# Patient Record
Sex: Female | Born: 1935 | Race: White | Hispanic: No | Smoking: Never smoker
Health system: Southern US, Community
[De-identification: ages and names within clinical notes are randomized; demographics above are authoritative.]

## PROBLEM LIST (undated history)

## (undated) DIAGNOSIS — C801 Malignant (primary) neoplasm, unspecified: Secondary | ICD-10-CM

## (undated) DIAGNOSIS — E119 Type 2 diabetes mellitus without complications: Secondary | ICD-10-CM

## (undated) DIAGNOSIS — I10 Essential (primary) hypertension: Secondary | ICD-10-CM

## (undated) DIAGNOSIS — R251 Tremor, unspecified: Secondary | ICD-10-CM

## (undated) DIAGNOSIS — F419 Anxiety disorder, unspecified: Secondary | ICD-10-CM

## (undated) DIAGNOSIS — I2510 Atherosclerotic heart disease of native coronary artery without angina pectoris: Secondary | ICD-10-CM

## (undated) DIAGNOSIS — N289 Disorder of kidney and ureter, unspecified: Secondary | ICD-10-CM

## (undated) DIAGNOSIS — I509 Heart failure, unspecified: Secondary | ICD-10-CM

## (undated) HISTORY — PX: LAPAROSCOPY: SHX197

## (undated) HISTORY — PX: ABDOMINAL HYSTERECTOMY: SHX81

## (undated) HISTORY — PX: TONSILLECTOMY: SUR1361

## (undated) HISTORY — PX: MASTECTOMY: SHX3

## (undated) HISTORY — PX: KIDNEY SURGERY: SHX687

## (undated) HISTORY — PX: ABDOMINAL SURGERY: SHX537

## (undated) HISTORY — PX: APPENDECTOMY: SHX54

---

## 2015-03-26 ENCOUNTER — Emergency Department (HOSPITAL_BASED_OUTPATIENT_CLINIC_OR_DEPARTMENT_OTHER): Payer: Medicare Other

## 2015-03-26 ENCOUNTER — Encounter (HOSPITAL_BASED_OUTPATIENT_CLINIC_OR_DEPARTMENT_OTHER): Payer: Self-pay | Admitting: *Deleted

## 2015-03-26 ENCOUNTER — Emergency Department (HOSPITAL_BASED_OUTPATIENT_CLINIC_OR_DEPARTMENT_OTHER)
Admission: EM | Admit: 2015-03-26 | Discharge: 2015-03-26 | Disposition: A | Discharge status: Home / Self Care | Payer: Medicare Other | Attending: Emergency Medicine | Admitting: Emergency Medicine

## 2015-03-26 DIAGNOSIS — K625 Hemorrhage of anus and rectum: Secondary | ICD-10-CM | POA: Insufficient documentation

## 2015-03-26 DIAGNOSIS — W19XXXA Unspecified fall, initial encounter: Secondary | ICD-10-CM

## 2015-03-26 DIAGNOSIS — S79911A Unspecified injury of right hip, initial encounter: Secondary | ICD-10-CM | POA: Insufficient documentation

## 2015-03-26 DIAGNOSIS — Z859 Personal history of malignant neoplasm, unspecified: Secondary | ICD-10-CM | POA: Insufficient documentation

## 2015-03-26 DIAGNOSIS — S0990XA Unspecified injury of head, initial encounter: Secondary | ICD-10-CM | POA: Insufficient documentation

## 2015-03-26 DIAGNOSIS — Y999 Unspecified external cause status: Secondary | ICD-10-CM | POA: Diagnosis not present

## 2015-03-26 DIAGNOSIS — E119 Type 2 diabetes mellitus without complications: Secondary | ICD-10-CM | POA: Insufficient documentation

## 2015-03-26 DIAGNOSIS — I2510 Atherosclerotic heart disease of native coronary artery without angina pectoris: Secondary | ICD-10-CM | POA: Insufficient documentation

## 2015-03-26 DIAGNOSIS — Y939 Activity, unspecified: Secondary | ICD-10-CM | POA: Insufficient documentation

## 2015-03-26 DIAGNOSIS — R55 Syncope and collapse: Secondary | ICD-10-CM | POA: Insufficient documentation

## 2015-03-26 DIAGNOSIS — Z7982 Long term (current) use of aspirin: Secondary | ICD-10-CM | POA: Diagnosis not present

## 2015-03-26 DIAGNOSIS — M25551 Pain in right hip: Secondary | ICD-10-CM

## 2015-03-26 DIAGNOSIS — R519 Headache, unspecified: Secondary | ICD-10-CM

## 2015-03-26 DIAGNOSIS — I509 Heart failure, unspecified: Secondary | ICD-10-CM | POA: Insufficient documentation

## 2015-03-26 DIAGNOSIS — Y929 Unspecified place or not applicable: Secondary | ICD-10-CM | POA: Insufficient documentation

## 2015-03-26 DIAGNOSIS — W01198A Fall on same level from slipping, tripping and stumbling with subsequent striking against other object, initial encounter: Secondary | ICD-10-CM | POA: Insufficient documentation

## 2015-03-26 DIAGNOSIS — Z79899 Other long term (current) drug therapy: Secondary | ICD-10-CM | POA: Insufficient documentation

## 2015-03-26 DIAGNOSIS — R51 Headache: Secondary | ICD-10-CM

## 2015-03-26 DIAGNOSIS — Z87448 Personal history of other diseases of urinary system: Secondary | ICD-10-CM | POA: Diagnosis not present

## 2015-03-26 DIAGNOSIS — S3992XA Unspecified injury of lower back, initial encounter: Secondary | ICD-10-CM | POA: Diagnosis not present

## 2015-03-26 DIAGNOSIS — Z794 Long term (current) use of insulin: Secondary | ICD-10-CM | POA: Insufficient documentation

## 2015-03-26 HISTORY — DX: Type 2 diabetes mellitus without complications: E11.9

## 2015-03-26 HISTORY — DX: Malignant (primary) neoplasm, unspecified: C80.1

## 2015-03-26 HISTORY — DX: Disorder of kidney and ureter, unspecified: N28.9

## 2015-03-26 HISTORY — DX: Heart failure, unspecified: I50.9

## 2015-03-26 HISTORY — DX: Atherosclerotic heart disease of native coronary artery without angina pectoris: I25.10

## 2015-03-26 LAB — CBC WITH DIFFERENTIAL/PLATELET
Basophils Absolute: 0 10*3/uL (ref 0.0–0.1)
Basophils Relative: 1 % (ref 0–1)
EOS ABS: 0.2 10*3/uL (ref 0.0–0.7)
Eosinophils Relative: 2 % (ref 0–5)
HCT: 34.6 % — ABNORMAL LOW (ref 36.0–46.0)
Hemoglobin: 12.2 g/dL (ref 12.0–15.0)
LYMPHS ABS: 2.8 10*3/uL (ref 0.7–4.0)
Lymphocytes Relative: 34 % (ref 12–46)
MCH: 33.7 pg (ref 26.0–34.0)
MCHC: 35.3 g/dL (ref 30.0–36.0)
MCV: 95.6 fL (ref 78.0–100.0)
MONOS PCT: 8 % (ref 3–12)
Monocytes Absolute: 0.7 10*3/uL (ref 0.1–1.0)
NEUTROS PCT: 55 % (ref 43–77)
Neutro Abs: 4.6 10*3/uL (ref 1.7–7.7)
Platelets: 342 10*3/uL (ref 150–400)
RBC: 3.62 MIL/uL — ABNORMAL LOW (ref 3.87–5.11)
RDW: 11.3 % — ABNORMAL LOW (ref 11.5–15.5)
WBC: 8.3 10*3/uL (ref 4.0–10.5)

## 2015-03-26 LAB — BASIC METABOLIC PANEL
ANION GAP: 8 (ref 5–15)
BUN: 22 mg/dL — ABNORMAL HIGH (ref 6–20)
CHLORIDE: 98 mmol/L — AB (ref 101–111)
CO2: 29 mmol/L (ref 22–32)
CREATININE: 0.94 mg/dL (ref 0.44–1.00)
Calcium: 10.4 mg/dL — ABNORMAL HIGH (ref 8.9–10.3)
GFR calc Af Amer: >60 mL/min (ref >60)
GFR calc non Af Amer: 55 mL/min — ABNORMAL LOW (ref >60)
Glucose, Bld: 176 mg/dL — ABNORMAL HIGH (ref 65–99)
POTASSIUM: 3.5 mmol/L (ref 3.5–5.1)
SODIUM: 135 mmol/L (ref 135–145)

## 2015-03-26 LAB — URINALYSIS, ROUTINE W REFLEX MICROSCOPIC
BILIRUBIN URINE: NEGATIVE
GLUCOSE, UA: 250 mg/dL — AB
HGB URINE DIPSTICK: NEGATIVE
KETONES UR: NEGATIVE mg/dL
NITRITE: NEGATIVE
PROTEIN: NEGATIVE mg/dL
Specific Gravity, Urine: 1.014 (ref 1.005–1.030)
UROBILINOGEN UA: 0.2 mg/dL (ref 0.0–1.0)
pH: 6 (ref 5.0–8.0)

## 2015-03-26 LAB — URINE MICROSCOPIC-ADD ON

## 2015-03-26 LAB — TROPONIN I: Troponin I: 0.03 ng/mL (ref <0.031)

## 2015-03-26 LAB — OCCULT BLOOD X 1 CARD TO LAB, STOOL: Fecal Occult Bld: NEGATIVE

## 2015-03-26 MED ORDER — ACETAMINOPHEN 325 MG PO TABS
650.0000 mg | ORAL_TABLET | Freq: Once | ORAL | Status: AC
  Administered 2015-03-26: 650 mg via ORAL
  Filled 2015-03-26: qty 2

## 2015-03-26 NOTE — ED Notes (Signed)
MD at bedside. 

## 2015-03-26 NOTE — ED Provider Notes (Signed)
CSN: [id number]     Arrival date & time 03/26/15  1711 History  This chart was scribed for Serita Grit, MD by Rayna Sexton, ED scribe. This patient was seen in room MH01/MH01 and the patient's care was started at 8:13 PM.  Chief Complaint  Patient presents with  . Fall   Patient is a 81 y.o. female presenting with fall. The history is provided by the patient and a relative. No language interpreter was used.  Fall This is a new problem. The current episode started more than 2 days ago. The problem occurs constantly. The problem has not changed since onset.Associated symptoms include headaches. Pertinent negatives include no chest pain and no shortness of breath. She has tried acetaminophen for the symptoms. The treatment provided mild relief.    HPI Comments: Regina Mccullough is a 81 y.o. female who presents to the Emergency Department complaining of a fall that occurred 5 days ago. Her daughter notes that she lost consciousness during the fall, hit her head on a chair and landed on concrete. She was taken to another ED immediately following the fall and had imaging performed to her head and neck with negative results but further notes that she was told by another physician that she needs a "full body scan". Her daughter notes that she saw fresh blood when wiping her rectum s/p a BM this morning and further notes pain to her right hip, buttock, lower back and posterior head and is unsure if she had any feelings of SOB or CP before or during the incident. She notes taking tylenol for her pain and experiencing temporary relief. Although her records indicate that she denied a LOC at her last ED visit, her daughter notes that they were supposed to have corrected this. Her daughter notes that she has stage three renal disease and a SHx to her kidneys. She denies any more episodes of syncope, dizziness, SOB and CP.   Past Medical History  Diagnosis Date  . Coronary artery disease   . Diabetes mellitus  without complication   . Renal disorder   . CHF (congestive heart failure)   . Cancer    Past Surgical History  Procedure Laterality Date  . Kidney surgery    . Abdominal hysterectomy    . Laparoscopy    . Tonsillectomy    . Abdominal surgery    . Appendectomy    . Mastectomy     No family history on file. History  Substance Use Topics  . Smoking status: Never Smoker   . Smokeless tobacco: Not on file  . Alcohol Use: No   OB History    No data available     Review of Systems  Respiratory: Negative for shortness of breath.   Cardiovascular: Negative for chest pain.  Gastrointestinal: Positive for anal bleeding.  Musculoskeletal: Positive for myalgias, back pain and arthralgias.  Neurological: Positive for headaches. Negative for dizziness, syncope and light-headedness.  All other systems reviewed and are negative.  Allergies  Actos; Januvia; Lipitor; Micronase; Neurontin; Propranolol; and Sulfa antibiotics  Home Medications   Prior to Admission medications   Medication Sig Start Date End Date Taking? Authorizing Provider  acetaminophen (TYLENOL) 500 MG tablet Take 1,000 mg by mouth every 6 (six) hours as needed.   Yes Historical Provider, MD  albuterol (PROVENTIL HFA;VENTOLIN HFA) 108 (90 BASE) MCG/ACT inhaler Inhale 1-2 puffs into the lungs every 6 (six) hours as needed for wheezing or shortness of breath.   Yes Historical Provider, MD  aspirin 81 MG tablet Take 81 mg by mouth daily.   Yes Historical Provider, MD  cholecalciferol (VITAMIN D) 400 UNITS TABS tablet Take 400 Units by mouth.   Yes Historical Provider, MD  Cinnamon 500 MG capsule Take 500 mg by mouth daily.   Yes Historical Provider, MD  co-enzyme Q-10 50 MG capsule Take 50 mg by mouth daily.   Yes Historical Provider, MD  diphenoxylate-atropine (LOMOTIL) 2.5-0.025 MG per tablet Take by mouth 4 (four) times daily as needed for diarrhea or loose stools.   Yes Historical Provider, MD  glimepiride (AMARYL) 2  MG tablet Take 2 mg by mouth daily with breakfast.   Yes Historical Provider, MD  guaiFENesin-codeine (ROBITUSSIN AC) 100-10 MG/5ML syrup Take 5 mLs by mouth 3 (three) times daily as needed for cough.   Yes Historical Provider, MD  hydrochlorothiazide (MICROZIDE) 12.5 MG capsule Take 12.5 mg by mouth daily.   Yes Historical Provider, MD  HYDROcodone-homatropine (HYCODAN) 5-1.5 MG/5ML syrup Take 5 mLs by mouth every 6 (six) hours as needed for cough.   Yes Historical Provider, MD  insulin glargine (LANTUS) 100 UNIT/ML injection Inject 100 Units into the skin at bedtime.   Yes Historical Provider, MD  loperamide (IMODIUM) 2 MG capsule Take by mouth as needed for diarrhea or loose stools.   Yes Historical Provider, MD  losartan (COZAAR) 25 MG tablet Take 25 mg by mouth daily.   Yes Historical Provider, MD  metoprolol succinate (TOPROL-XL) 50 MG 24 hr tablet Take 50 mg by mouth daily. Take with or immediately following a meal.   Yes Historical Provider, MD  rosuvastatin (CRESTOR) 5 MG tablet Take 5 mg by mouth daily.   Yes Historical Provider, MD  tiZANidine (ZANAFLEX) 2 MG tablet Take by mouth every 6 (six) hours as needed for muscle spasms.   Yes Historical Provider, MD  traZODone (DESYREL) 100 MG tablet Take 100 mg by mouth at bedtime.   Yes Historical Provider, MD  valACYclovir (VALTREX) 500 MG tablet Take 500 mg by mouth 2 (two) times daily.   Yes Historical Provider, MD   BP 148/57 mmHg  Pulse 88  Temp(Src) 98.7 F (37.1 C) (Oral)  Resp 18  Ht 5\' 6"  (1.676 m)  Wt 145 lb (65.772 kg)  BMI 23.41 kg/m2  SpO2 96% Physical Exam  Constitutional: She is oriented to person, place, and time. She appears well-developed and well-nourished. No distress.  HENT:  Head: Normocephalic and atraumatic. Head is without raccoon's eyes, without Battle's sign and without contusion (occipital tenderness).  Nose: Nose normal.  Eyes: Conjunctivae and EOM are normal. Pupils are equal, round, and reactive to light.  No scleral icterus.  Neck: No spinous process tenderness and no muscular tenderness present.  Cardiovascular: Normal rate, regular rhythm, normal heart sounds and intact distal pulses.   No murmur heard. Pulmonary/Chest: Effort normal and breath sounds normal. She has no rales. She exhibits no tenderness.  Abdominal: Soft. There is no tenderness. There is no rebound and no guarding.  Genitourinary: Rectal exam shows no external hemorrhoid, no fissure, no mass, no tenderness and anal tone normal.     No stool in rectal vault and no gross blood.  Musculoskeletal: Normal range of motion. She exhibits no edema.       Right hip: She exhibits tenderness and bony tenderness. She exhibits normal range of motion (NV intact distally), normal strength, no swelling and no deformity.       Thoracic back: She exhibits no tenderness and no  bony tenderness.       Lumbar back: She exhibits no tenderness and no bony tenderness.  No evidence of trauma to extremities, except as noted.  2+ distal pulses.    Neurological: She is alert and oriented to person, place, and time.  Skin: Skin is warm and dry. No rash noted.  Psychiatric: She has a normal mood and affect.  Nursing note and vitals reviewed.   ED Course  Procedures  DIAGNOSTIC STUDIES: Oxygen Saturation is 96% on RA, normal by my interpretation.    COORDINATION OF CARE: 8:28 PM Discussed treatment plan with pt at bedside and pt agreed to plan.  Labs Review Labs Reviewed  URINALYSIS, ROUTINE W REFLEX MICROSCOPIC (NOT AT [facility]) - Abnormal; Notable for the following:    Glucose, UA 250 (*)    Leukocytes, UA TRACE (*)    All other components within normal limits  CBC WITH DIFFERENTIAL/PLATELET - Abnormal; Notable for the following:    RBC 3.62 (*)    HCT 34.6 (*)    RDW 11.3 (*)    All other components within normal limits  BASIC METABOLIC PANEL - Abnormal; Notable for the following:    Chloride 98 (*)    Glucose, Bld 176 (*)    BUN 22 (*)     Calcium 10.4 (*)    GFR calc non Af Amer 55 (*)    All other components within normal limits  URINE MICROSCOPIC-ADD ON  TROPONIN I    Imaging Review Dg Hip Unilat With Pelvis 2-3 Views Right  03/26/2015   CLINICAL DATA:  [place] 5 days ago. Landed on concrete. Right hip pain since that time. Pain is posterior. Unable to bear weight.  EXAM: DG HIP (WITH OR WITHOUT PELVIS) 2-3V RIGHT  COMPARISON:  None.  FINDINGS: There is no evidence of hip fracture or dislocation. Mild degenerative changes in both hips.  IMPRESSION: No radiographic evidence for fracture. Consider MRI as needed depending the clinical level of suspicion for fracture.   Electronically Signed   By: Nolon Nations M.D.   On: 03/26/2015 21:56     EKG Interpretation   Date/Time:  Monday March 26 2015 21:05:15 EDT Ventricular Rate:  68 PR Interval:  232 QRS Duration: 86 QT Interval:  394 QTC Calculation: 418 R Axis:   31 Text Interpretation:  Sinus rhythm with 1st degree A-V block Otherwise  normal ECG No old tracing to compare Confirmed by [facility]  MD, TREY ([id number])  on 03/26/2015 9:34:28 PM     MDM   Final diagnoses:  Fall  Syncope, unspecified syncope type  Right hip pain  Nonintractable headache, unspecified chronicity pattern, unspecified headache type    81 yo female who passed out last week which caused her to fall and strike her head.  Daughter reports that she went to [facility], where she had a CT scan of head.  On chart review, she negative head and C spine CTs.  Also had EKG, but unable to view this remotely.  Daughter does not think [facility] knew that she passed out.  Plan to eval her right hip and check basic labs for syncope eval.  Given time since syncope (and report of no syncope or near syncope since) she can discharged if workup negative.    She also reports one episode of bright red blood on her toilet paper after a bowel movement this morning.  None since.  No evidence of GI bleed on exam.   Again, labs should  help risk stratify.    11:11 PM labs reassuring.  Right hip xr negative.  I don't suspect occult hip fracture as it has been several days since injury and she has been ambulatory.  Plan dc with PC Pfollow up.    I personally performed the services described in this documentation, which was scribed in my presence. The recorded information has been reviewed and is accurate.     Serita Grit, MD 03/26/15 2312

## 2015-03-26 NOTE — ED Notes (Signed)
Pt passed out and fell last Wednesday and was seen at [facility] ER. Pt sts she continues to have a HA and right buttocks pain. Pt also c/o of one episode today of bright red blood on the toilet paper after a BM. Pt sts no blood in her stool and she denies hemorrhoids.

## 2015-03-26 NOTE — ED Notes (Signed)
Patient was seen at [facility] for fall. States she had CT head.  Daughter reports "we would like a full body scan".  Explained that we don't scan "full body" and that it would be more appropriate to scan where she complains of pain.  Daughter states "well she hit her whole body when she fell".  Patient states "I hurt back here (pointing to lower back) and in my head".

## 2015-03-26 NOTE — ED Notes (Signed)
MD at bedside.  Obtained hemoccult with myself at the bedside.  Sent to lab.

## 2015-03-26 NOTE — ED Notes (Signed)
Nurse first-pt alert-NAD-using own walker-refused w/c

## 2015-03-26 NOTE — Discharge Instructions (Signed)
Syncope °Syncope is a medical term for fainting or passing out. This means you lose consciousness and drop to the ground. People are generally unconscious for less than 5 minutes. You may have some muscle twitches for up to 15 seconds before waking up and returning to normal. Syncope occurs more often in older adults, but it can happen to anyone. While most causes of syncope are not dangerous, syncope can be a sign of a serious medical problem. It is important to seek medical care.  °CAUSES  °Syncope is caused by a sudden drop in blood flow to the brain. The specific cause is often not determined. Factors that can bring on syncope include: °· Taking medicines that lower blood pressure. °· Sudden changes in posture, such as standing up quickly. °· Taking more medicine than prescribed. °· Standing in one place for too long. °· Seizure disorders. °· Dehydration and excessive exposure to heat. °· Low blood sugar (hypoglycemia). °· Straining to have a bowel movement. °· Heart disease, irregular heartbeat, or other circulatory problems. °· Fear, emotional distress, seeing blood, or severe pain. °SYMPTOMS  °Right before fainting, you may: °· Feel dizzy or light-headed. °· Feel nauseous. °· See all white or all black in your field of vision. °· Have cold, clammy skin. °DIAGNOSIS  °Your health care provider will ask about your symptoms, perform a physical exam, and perform an electrocardiogram (ECG) to record the electrical activity of your heart. Your health care provider may also perform other heart or blood tests to determine the cause of your syncope which may include: °· Transthoracic echocardiogram (TTE). During echocardiography, sound waves are used to evaluate how blood flows through your heart. °· Transesophageal echocardiogram (TEE). °· Cardiac monitoring. This allows your health care provider to monitor your heart rate and rhythm in real time. °· Holter monitor. This is a portable device that records your  heartbeat and can help diagnose heart arrhythmias. It allows your health care provider to track your heart activity for several days, if needed. °· Stress tests by exercise or by giving medicine that makes the heart beat faster. °TREATMENT  °In most cases, no treatment is needed. Depending on the cause of your syncope, your health care provider may recommend changing or stopping some of your medicines. °HOME CARE INSTRUCTIONS °· Have someone stay with you until you feel stable. °· Do not drive, use machinery, or play sports until your health care provider says it is okay. °· Keep all follow-up appointments as directed by your health care provider. °· Lie down right away if you start feeling like you might faint. Breathe deeply and steadily. Wait until all the symptoms have passed. °· Drink enough fluids to keep your urine clear or pale yellow. °· If you are taking blood pressure or heart medicine, get up slowly and take several minutes to sit and then stand. This can reduce dizziness. °SEEK IMMEDIATE MEDICAL CARE IF:  °· You have a severe headache. °· You have unusual pain in the chest, abdomen, or back. °· You are bleeding from your mouth or rectum, or you have black or tarry stool. °· You have an irregular or very fast heartbeat. °· You have pain with breathing. °· You have repeated fainting or seizure-like jerking during an episode. °· You faint when sitting or lying down. °· You have confusion. °· You have trouble walking. °· You have severe weakness. °· You have vision problems. °If you fainted, call your local emergency services (911 in [place] Do not drive   yourself to the hospital.  °MAKE SURE YOU: °· Understand these instructions. °· Will watch your condition. °· Will get help right away if you are not doing well or get worse. °Document Released: 08/18/2005 Document Revised: 08/23/2013 Document Reviewed: 10/17/2011 °[employer]® Patient Information ©2015 [employer]. This information is not intended to replace  advice given to you by your health care provider. Make sure you discuss any questions you have with your health care provider. ° °

## 2015-03-26 NOTE — ED Notes (Signed)
Nurse first-pt walking with own walker-NAD

## 2015-03-26 NOTE — ED Notes (Signed)
Nurse first-pt resting with eyes closed-blanket for comfort-daughter seated at her side

## 2015-03-31 ENCOUNTER — Encounter (HOSPITAL_BASED_OUTPATIENT_CLINIC_OR_DEPARTMENT_OTHER): Payer: Self-pay | Admitting: *Deleted

## 2015-03-31 ENCOUNTER — Emergency Department (HOSPITAL_BASED_OUTPATIENT_CLINIC_OR_DEPARTMENT_OTHER)
Admission: EM | Admit: 2015-03-31 | Discharge: 2015-03-31 | Disposition: A | Discharge status: Home / Self Care | Payer: Medicare Other | Attending: Emergency Medicine | Admitting: Emergency Medicine

## 2015-03-31 DIAGNOSIS — H00036 Abscess of eyelid left eye, unspecified eyelid: Secondary | ICD-10-CM | POA: Diagnosis not present

## 2015-03-31 DIAGNOSIS — I2510 Atherosclerotic heart disease of native coronary artery without angina pectoris: Secondary | ICD-10-CM | POA: Diagnosis not present

## 2015-03-31 DIAGNOSIS — Z7982 Long term (current) use of aspirin: Secondary | ICD-10-CM | POA: Insufficient documentation

## 2015-03-31 DIAGNOSIS — Z87448 Personal history of other diseases of urinary system: Secondary | ICD-10-CM | POA: Diagnosis not present

## 2015-03-31 DIAGNOSIS — Z794 Long term (current) use of insulin: Secondary | ICD-10-CM | POA: Insufficient documentation

## 2015-03-31 DIAGNOSIS — I509 Heart failure, unspecified: Secondary | ICD-10-CM | POA: Diagnosis not present

## 2015-03-31 DIAGNOSIS — R220 Localized swelling, mass and lump, head: Secondary | ICD-10-CM | POA: Diagnosis present

## 2015-03-31 DIAGNOSIS — Z79899 Other long term (current) drug therapy: Secondary | ICD-10-CM | POA: Diagnosis not present

## 2015-03-31 DIAGNOSIS — L03213 Periorbital cellulitis: Secondary | ICD-10-CM

## 2015-03-31 DIAGNOSIS — Z859 Personal history of malignant neoplasm, unspecified: Secondary | ICD-10-CM | POA: Diagnosis not present

## 2015-03-31 DIAGNOSIS — E119 Type 2 diabetes mellitus without complications: Secondary | ICD-10-CM | POA: Diagnosis not present

## 2015-03-31 LAB — BASIC METABOLIC PANEL
Anion gap: 11 (ref 5–15)
BUN: 16 mg/dL (ref 6–20)
CALCIUM: 10.3 mg/dL (ref 8.9–10.3)
CO2: 25 mmol/L (ref 22–32)
Chloride: 96 mmol/L — ABNORMAL LOW (ref 101–111)
Creatinine, Ser: 0.83 mg/dL (ref 0.44–1.00)
Glucose, Bld: 312 mg/dL — ABNORMAL HIGH (ref 65–99)
Potassium: 3.8 mmol/L (ref 3.5–5.1)
SODIUM: 132 mmol/L — AB (ref 135–145)

## 2015-03-31 LAB — CBC
HCT: 36.1 % (ref 36.0–46.0)
HEMOGLOBIN: 12.5 g/dL (ref 12.0–15.0)
MCH: 33.2 pg (ref 26.0–34.0)
MCHC: 34.6 g/dL (ref 30.0–36.0)
MCV: 96 fL (ref 78.0–100.0)
PLATELETS: 297 10*3/uL (ref 150–400)
RBC: 3.76 MIL/uL — ABNORMAL LOW (ref 3.87–5.11)
RDW: 11.5 % (ref 11.5–15.5)
WBC: 8.4 10*3/uL (ref 4.0–10.5)

## 2015-03-31 MED ORDER — CLINDAMYCIN HCL 150 MG PO CAPS
300.0000 mg | ORAL_CAPSULE | Freq: Three times a day (TID) | ORAL | Status: DC
  Administered 2015-03-31: 300 mg via ORAL
  Filled 2015-03-31: qty 2

## 2015-03-31 MED ORDER — IBUPROFEN 400 MG PO TABS: 400.0000 mg | ORAL_TABLET | Freq: Four times a day (QID) | ORAL | Status: AC | PRN

## 2015-03-31 MED ORDER — FLUORESCEIN SODIUM 1 MG OP STRP
1.0000 | ORAL_STRIP | Freq: Once | OPHTHALMIC | Status: DC
  Filled 2015-03-31: qty 1

## 2015-03-31 MED ORDER — CEPHALEXIN 250 MG PO CAPS: 250.0000 mg | ORAL_CAPSULE | Freq: Four times a day (QID) | ORAL | Status: AC

## 2015-03-31 MED ORDER — OXYCODONE-ACETAMINOPHEN 5-325 MG PO TABS
1.0000 | ORAL_TABLET | Freq: Once | ORAL | Status: AC
  Administered 2015-03-31: 1 via ORAL
  Filled 2015-03-31: qty 1

## 2015-03-31 MED ORDER — DOXYCYCLINE HYCLATE 50 MG PO CAPS: 100.0000 mg | ORAL_CAPSULE | Freq: Two times a day (BID) | ORAL | Status: AC

## 2015-03-31 MED ORDER — TETRACAINE HCL 0.5 % OP SOLN
1.0000 [drp] | Freq: Once | OPHTHALMIC | Status: DC
Start: 2015-03-31 — End: 2015-03-31
  Filled 2015-03-31: qty 2

## 2015-03-31 MED ORDER — GENTAMICIN SULFATE 0.3 % OP SOLN: 2.0000 [drp] | OPHTHALMIC | Status: AC

## 2015-03-31 NOTE — ED Provider Notes (Signed)
CSN: [id number]     Arrival date & time 03/31/15  1307 History   First MD Initiated Contact with Patient 03/31/15 1319     Chief Complaint  Patient presents with  . Facial Swelling     (Consider location/radiation/quality/duration/timing/severity/associated sxs/prior Treatment) HPI Comments: Pt. Is an 81 y/o F with multiple comorbid medical problems, but comes in today with 1-2 days of ongoing left eye swelling, discharge and pain. Her daughter says that she was called by the nursing home last night because of the swelling around her left eye. She has some pain relieved by warm / cold compresses. Pt. Says that she has some blurry vision mostly due to the pain. She says that she has not had any fever, chills, eye pressure, nausea, vomiting or headache. No mental status changes.   The history is provided by the patient and a relative.    Past Medical History  Diagnosis Date  . Coronary artery disease   . Diabetes mellitus without complication   . Renal disorder   . CHF (congestive heart failure)   . Cancer    Past Surgical History  Procedure Laterality Date  . Kidney surgery    . Abdominal hysterectomy    . Laparoscopy    . Tonsillectomy    . Abdominal surgery    . Appendectomy    . Mastectomy     No family history on file. History  Substance Use Topics  . Smoking status: Never Smoker   . Smokeless tobacco: Not on file  . Alcohol Use: No   OB History    No data available     Review of Systems  Constitutional: Negative.  Negative for fever, chills, diaphoresis, activity change, appetite change, fatigue and unexpected weight change.  HENT: Positive for facial swelling. Negative for congestion, ear pain, mouth sores, postnasal drip, rhinorrhea, sinus pressure, sore throat, trouble swallowing and voice change.   Eyes: Positive for pain, discharge, redness and visual disturbance. Negative for photophobia and itching.  Respiratory: Negative.  Negative for cough, choking, chest  tightness and shortness of breath.   Cardiovascular: Negative.  Negative for chest pain, palpitations and leg swelling.  Gastrointestinal: Negative.  Negative for nausea, vomiting, abdominal pain, diarrhea, blood in stool and abdominal distention.  Endocrine: Negative.  Negative for polyuria.  Genitourinary: Negative.  Negative for dysuria, urgency and frequency.  Musculoskeletal: Negative.  Negative for myalgias, back pain, joint swelling, neck pain and neck stiffness.  Skin: Negative.  Negative for pallor and rash.  Allergic/Immunologic: Negative.   Neurological: Negative for dizziness, seizures, syncope, weakness, numbness and headaches.  Hematological: Negative.  Negative for adenopathy.  Psychiatric/Behavioral: Negative.  Negative for agitation.      Allergies  Actos; Januvia; Lipitor; Micronase; Neurontin; Propranolol; and Sulfa antibiotics  Home Medications   Prior to Admission medications   Medication Sig Start Date End Date Taking? Authorizing Provider  acetaminophen (TYLENOL) 500 MG tablet Take 1,000 mg by mouth every 6 (six) hours as needed.    Historical Provider, MD  albuterol (PROVENTIL HFA;VENTOLIN HFA) 108 (90 BASE) MCG/ACT inhaler Inhale 1-2 puffs into the lungs every 6 (six) hours as needed for wheezing or shortness of breath.    Historical Provider, MD  aspirin 81 MG tablet Take 81 mg by mouth daily.    Historical Provider, MD  cephALEXin (KEFLEX) 250 MG capsule Take 1 capsule (250 mg total) by mouth 4 (four) times daily. 03/31/15   Aquilla Hacker, MD  cholecalciferol (VITAMIN D) 400 UNITS TABS  tablet Take 400 Units by mouth.    Historical Provider, MD  Cinnamon 500 MG capsule Take 500 mg by mouth daily.    Historical Provider, MD  co-enzyme Q-10 50 MG capsule Take 50 mg by mouth daily.    Historical Provider, MD  diphenoxylate-atropine (LOMOTIL) 2.5-0.025 MG per tablet Take by mouth 4 (four) times daily as needed for diarrhea or loose stools.    Historical Provider,  MD  doxycycline (VIBRAMYCIN) 50 MG capsule Take 2 capsules (100 mg total) by mouth 2 (two) times daily. 03/31/15   Aquilla Hacker, MD  gentamicin (GARAMYCIN) 0.3 % ophthalmic solution Place 2 drops into the left eye every 4 (four) hours. 03/31/15   York Ram Roshawna Colclasure, MD  glimepiride (AMARYL) 2 MG tablet Take 2 mg by mouth daily with breakfast.    Historical Provider, MD  guaiFENesin-codeine (ROBITUSSIN AC) 100-10 MG/5ML syrup Take 5 mLs by mouth 3 (three) times daily as needed for cough.    Historical Provider, MD  hydrochlorothiazide (MICROZIDE) 12.5 MG capsule Take 12.5 mg by mouth daily.    Historical Provider, MD  HYDROcodone-homatropine (HYCODAN) 5-1.5 MG/5ML syrup Take 5 mLs by mouth every 6 (six) hours as needed for cough.    Historical Provider, MD  ibuprofen (ADVIL,MOTRIN) 400 MG tablet Take 1 tablet (400 mg total) by mouth every 6 (six) hours as needed. 03/31/15   York Ram Jaden Batchelder, MD  insulin glargine (LANTUS) 100 UNIT/ML injection Inject 100 Units into the skin at bedtime.    Historical Provider, MD  loperamide (IMODIUM) 2 MG capsule Take by mouth as needed for diarrhea or loose stools.    Historical Provider, MD  losartan (COZAAR) 25 MG tablet Take 25 mg by mouth daily.    Historical Provider, MD  metoprolol succinate (TOPROL-XL) 50 MG 24 hr tablet Take 50 mg by mouth daily. Take with or immediately following a meal.    Historical Provider, MD  rosuvastatin (CRESTOR) 5 MG tablet Take 5 mg by mouth daily.    Historical Provider, MD  tiZANidine (ZANAFLEX) 2 MG tablet Take by mouth every 6 (six) hours as needed for muscle spasms.    Historical Provider, MD  traZODone (DESYREL) 100 MG tablet Take 100 mg by mouth at bedtime.    Historical Provider, MD  valACYclovir (VALTREX) 500 MG tablet Take 500 mg by mouth 2 (two) times daily.    Historical Provider, MD   BP 189/64 mmHg  Pulse 65  Resp 16  SpO2 99% Physical Exam  Constitutional: She is oriented to person, place, and time. She appears  well-developed and well-nourished. No distress.  HENT:  Head: Normocephalic and atraumatic.  Right Ear: Hearing, tympanic membrane and external ear normal.  Left Ear: Hearing, tympanic membrane and external ear normal.  Nose: Nose normal. Right sinus exhibits no maxillary sinus tenderness and no frontal sinus tenderness. Left sinus exhibits no maxillary sinus tenderness and no frontal sinus tenderness.  Mouth/Throat: Uvula is midline, oropharynx is clear and moist and mucous membranes are normal. Normal dentition. No oropharyngeal exudate, posterior oropharyngeal edema, posterior oropharyngeal erythema or tonsillar abscesses.  Eyes: EOM are normal. Pupils are equal, round, and reactive to light. Right eye exhibits no discharge and no exudate. No foreign body present in the right eye. Left eye exhibits discharge and exudate. Left eye exhibits no chemosis. No foreign body present in the left eye. Right conjunctiva is not injected. Right conjunctiva has no hemorrhage. Left conjunctiva is injected. Left conjunctiva has no hemorrhage. No scleral icterus.  Right eye exhibits normal extraocular motion and no nystagmus. Left eye exhibits normal extraocular motion and no nystagmus. Right pupil is round and reactive. Left pupil is round and reactive. Pupils are equal.    Neck: Normal range of motion. Neck supple. No thyromegaly present.  Cardiovascular: Normal rate, regular rhythm, normal heart sounds and intact distal pulses.  Exam reveals no gallop and no friction rub.   No murmur heard. Pulmonary/Chest: Effort normal and breath sounds normal. No respiratory distress. She has no wheezes. She has no rales. She exhibits no tenderness.  Abdominal: Soft. Bowel sounds are normal. She exhibits no distension. There is no tenderness. There is no rebound and no guarding.  Musculoskeletal: Normal range of motion. She exhibits no edema or tenderness.  Lymphadenopathy:    She has no cervical adenopathy.  Neurological:  She is alert and oriented to person, place, and time. No cranial nerve deficit. She exhibits normal muscle tone. Coordination normal.  Skin: Skin is warm and dry. No rash noted. She is not diaphoretic. No erythema. No pallor.  Psychiatric: She has a normal mood and affect. Her behavior is normal.    ED Course  Procedures (including critical care time) Labs Review Labs Reviewed  BASIC METABOLIC PANEL - Abnormal; Notable for the following:    Sodium 132 (*)    Chloride 96 (*)    Glucose, Bld 312 (*)    All other components within normal limits  CBC - Abnormal; Notable for the following:    RBC 3.76 (*)    All other components within normal limits    Imaging Review No results found.   EKG Interpretation None      MDM   Final diagnoses:  Preseptal cellulitis of left eye   Pt. Here with preseptal cellulitis of her left eye clinically. She has not had a fever. She has an elevated glucose likely as a result of the infection. She has experienced relief with pain medicine and antibiotics. Wet / cold compresses help as well. Plan for discharge with Keflex and Doxycycline for 10 days in addition to gentamicin eye drops. Pt. Improving. Safe and stable for discharge to home with close follow up with ophthalmology and with her PCP as needed. Return precautions reviewed.     Aquilla Hacker, MD 03/31/15 1556  Leonard Schwartz, MD 04/01/15 0730

## 2015-03-31 NOTE — ED Notes (Signed)
Left eye swelling for several days. Pt lives at [facility]. Brought in by her daughter. Ambulatory to triage with own walker

## 2015-03-31 NOTE — Discharge Instructions (Signed)
Orbital Cellulitis   Orbital cellulitis is an infection of the soft tissue of the orbit without abscess formation. The eye socket is the area around and behind the eye. It usually comes on suddenly in children, but can happen at any age. This condition can lead to death if untreated.   CAUSES    A germ (bacterial) infection of the area around and behind the eye.   Long-term (chronic) sinus infections.   An object (foreign body) stuck behind the eye.   An injury that goes through (penetrates) to tissues of the eyelids.   Trauma with secondary infection.   Fracture of the boney wall or floor of the orbit.   Serious eyelid infections.   Bite wounds.   Inflammation or infection of the lining membranes of the brain (meningitis).   Blood poisoning or infection (septicemia).   Dental area filled with pus (abscesses).   Severe uncontrolled diabetes (ketoacidosis).  SYMPTOMS    Pain in the eye.   Redness and puffiness (swelling) of the eyelids. The swelling is often bad enough that the eye has to close.   Fever and feeling generally ill.   The lids and the cheek may be very red, hot and swollen.   A drop in vision.   Pain when touching the area around the eye.   The eye itself may look like it is "popping out" (proptosis).   Double vision - seeing two of everything (diplopia).  DIAGNOSIS   An ophthalmologist can tell you if you have orbital cellulitis during an eye exam. It is important to know if the infection goes into the area behind the eye. True orbital cellulitis is a medical emergency. A CT scan may be needed to see if the sinuses are involved. A CT scan can also see if an abscess has formed behind the eye.  TREATMENT   Orbital cellulitis should be treated in the hospital with medicine that kills germs (antibiotics). These antibiotics are given right into the bloodstream through a vein (intravenous).  SEEK IMMEDIATE MEDICAL CARE IF:    You have red, swollen eyelids.   You have a fever.   You  develop double vision.  MAKE SURE YOU:    Understand these instructions.   Will watch your condition.   Will get help right away if you are not doing well or get worse.  Document Released: 08/12/2001 Document Revised: 11/10/2011 Document Reviewed: 12/13/2007  ExitCare Patient Information 2015 [employer]. This information is not intended to replace advice given to you by your health care provider. Make sure you discuss any questions you have with your health care provider.

## 2015-12-13 ENCOUNTER — Emergency Department (HOSPITAL_BASED_OUTPATIENT_CLINIC_OR_DEPARTMENT_OTHER)
Admission: EM | Admit: 2015-12-13 | Discharge: 2015-12-13 | Disposition: A | Discharge status: Home / Self Care | Payer: Medicare Other | Attending: Emergency Medicine | Admitting: Emergency Medicine

## 2015-12-13 ENCOUNTER — Encounter (HOSPITAL_BASED_OUTPATIENT_CLINIC_OR_DEPARTMENT_OTHER): Payer: Self-pay | Admitting: Emergency Medicine

## 2015-12-13 ENCOUNTER — Emergency Department (HOSPITAL_BASED_OUTPATIENT_CLINIC_OR_DEPARTMENT_OTHER): Payer: Medicare Other

## 2015-12-13 DIAGNOSIS — Y939 Activity, unspecified: Secondary | ICD-10-CM | POA: Insufficient documentation

## 2015-12-13 DIAGNOSIS — W19XXXA Unspecified fall, initial encounter: Secondary | ICD-10-CM

## 2015-12-13 DIAGNOSIS — Z853 Personal history of malignant neoplasm of breast: Secondary | ICD-10-CM | POA: Insufficient documentation

## 2015-12-13 DIAGNOSIS — S6992XA Unspecified injury of left wrist, hand and finger(s), initial encounter: Secondary | ICD-10-CM | POA: Diagnosis present

## 2015-12-13 DIAGNOSIS — M25532 Pain in left wrist: Secondary | ICD-10-CM

## 2015-12-13 DIAGNOSIS — Y929 Unspecified place or not applicable: Secondary | ICD-10-CM | POA: Diagnosis not present

## 2015-12-13 DIAGNOSIS — Z794 Long term (current) use of insulin: Secondary | ICD-10-CM | POA: Diagnosis not present

## 2015-12-13 DIAGNOSIS — E119 Type 2 diabetes mellitus without complications: Secondary | ICD-10-CM | POA: Insufficient documentation

## 2015-12-13 DIAGNOSIS — Y999 Unspecified external cause status: Secondary | ICD-10-CM | POA: Insufficient documentation

## 2015-12-13 DIAGNOSIS — I10 Essential (primary) hypertension: Secondary | ICD-10-CM | POA: Diagnosis not present

## 2015-12-13 DIAGNOSIS — I509 Heart failure, unspecified: Secondary | ICD-10-CM | POA: Diagnosis not present

## 2015-12-13 DIAGNOSIS — S60212A Contusion of left wrist, initial encounter: Secondary | ICD-10-CM | POA: Diagnosis not present

## 2015-12-13 DIAGNOSIS — I2510 Atherosclerotic heart disease of native coronary artery without angina pectoris: Secondary | ICD-10-CM | POA: Diagnosis not present

## 2015-12-13 DIAGNOSIS — R600 Localized edema: Secondary | ICD-10-CM | POA: Insufficient documentation

## 2015-12-13 DIAGNOSIS — Z79899 Other long term (current) drug therapy: Secondary | ICD-10-CM | POA: Diagnosis not present

## 2015-12-13 DIAGNOSIS — W101XXA Fall (on)(from) sidewalk curb, initial encounter: Secondary | ICD-10-CM | POA: Insufficient documentation

## 2015-12-13 HISTORY — DX: Anxiety disorder, unspecified: F41.9

## 2015-12-13 HISTORY — DX: Essential (primary) hypertension: I10

## 2015-12-13 HISTORY — DX: Tremor, unspecified: R25.1

## 2015-12-13 MED ORDER — OXYCODONE HCL 5 MG PO TABS
2.5000 mg | ORAL_TABLET | Freq: Once | ORAL | Status: AC
  Administered 2015-12-13: 2.5 mg via ORAL
  Filled 2015-12-13: qty 1

## 2015-12-13 MED ORDER — ACETAMINOPHEN 500 MG PO TABS
1000.0000 mg | ORAL_TABLET | Freq: Once | ORAL | Status: AC
  Administered 2015-12-13: 1000 mg via ORAL
  Filled 2015-12-13: qty 2

## 2015-12-13 MED ORDER — AMOXICILLIN 875 MG PO TABS: 875.0000 mg | ORAL_TABLET | Freq: Two times a day (BID) | ORAL | Status: DC

## 2015-12-13 NOTE — Discharge Instructions (Signed)

## 2015-12-13 NOTE — ED Provider Notes (Signed)
CSN: [id number]     Arrival date & time 12/13/15  1830 History  By signing my name below, I, Regina Mccullough, attest that this documentation has been prepared under the direction and in the presence of Deno Etienne, DO. Electronically Signed: Virgel Bouquet, ED Scribe. 12/13/2015. 8:59 PM.   Chief Complaint  Patient presents with  . Fall    LUE pain   Patient is a 82 y.o. female presenting with fall. The history is provided by the patient. No language interpreter was used.  Fall This is a new problem. The current episode started 3 to 5 hours ago. The problem occurs rarely. The problem has been gradually improving. Pertinent negatives include no chest pain, no headaches and no shortness of breath.  HPI Comments: Regina Mccullough is a 82 y.o. female who presents to the Emergency Department after a fall. Patient reports that she was feeding a bird and stepped off the sidewalk to the grass where she lost her balance and fell. She struck the left side of her body on the grass and which was followed immediately by pain in her left wrist, left hand, left shoulder. She states that the pain is constant and moderate in severity since the fall. Pain worse with movement of the hand and palpation. Hx of breast cancer. Denies LOC, numbness, paraesthesias, leg swelling different from baseline, hip pain, back pain, knee pain, ankle pain, neck pain, and or any other symptoms.  Past Medical History  Diagnosis Date  . Coronary artery disease   . Diabetes mellitus without complication ([place])   . Renal disorder   . CHF (congestive heart failure) ([place])   . Cancer ([place])   . Anxiety   . Hypertension   . Tremors of nervous system     "essemtial tremors"   Past Surgical History  Procedure Laterality Date  . Kidney surgery    . Abdominal hysterectomy    . Laparoscopy    . Tonsillectomy    . Abdominal surgery    . Appendectomy    . Mastectomy     History reviewed. No pertinent family history. Social  History  Substance Use Topics  . Smoking status: Never Smoker   . Smokeless tobacco: None  . Alcohol Use: No   OB History    No data available     Review of Systems  Constitutional: Negative for fever and chills.  HENT: Negative for congestion and rhinorrhea.   Eyes: Negative for redness and visual disturbance.  Respiratory: Negative for shortness of breath and wheezing.   Cardiovascular: Positive for leg swelling (baseline). Negative for chest pain and palpitations.  Gastrointestinal: Negative for nausea and vomiting.  Genitourinary: Negative for dysuria and urgency.  Musculoskeletal: Positive for myalgias (left forearm) and arthralgias (left wrist and left shoulder). Negative for back pain and neck pain.  Skin: Negative for pallor and wound.  Neurological: Negative for dizziness and headaches.      Allergies  Actos; Januvia; Lipitor; Micronase; Neurontin; Propranolol; and Sulfa antibiotics  Home Medications   Prior to Admission medications   Medication Sig Start Date End Date Taking? Authorizing Provider  acetaminophen (TYLENOL) 500 MG tablet Take 1,000 mg by mouth every 6 (six) hours as needed.    Historical Provider, MD  albuterol (PROVENTIL HFA;VENTOLIN HFA) 108 (90 BASE) MCG/ACT inhaler Inhale 1-2 puffs into the lungs every 6 (six) hours as needed for wheezing or shortness of breath.    Historical Provider, MD  aspirin 81 MG tablet Take 81 mg by mouth  daily.    Historical Provider, MD  cephALEXin (KEFLEX) 250 MG capsule Take 1 capsule (250 mg total) by mouth 4 (four) times daily. 03/31/15   Aquilla Hacker, MD  cholecalciferol (VITAMIN D) 400 UNITS TABS tablet Take 400 Units by mouth.    Historical Provider, MD  Cinnamon 500 MG capsule Take 500 mg by mouth daily.    Historical Provider, MD  co-enzyme Q-10 50 MG capsule Take 50 mg by mouth daily.    Historical Provider, MD  diphenoxylate-atropine (LOMOTIL) 2.5-0.025 MG per tablet Take by mouth 4 (four) times daily as  needed for diarrhea or loose stools.    Historical Provider, MD  doxycycline (VIBRAMYCIN) 50 MG capsule Take 2 capsules (100 mg total) by mouth 2 (two) times daily. 03/31/15   Aquilla Hacker, MD  gentamicin (GARAMYCIN) 0.3 % ophthalmic solution Place 2 drops into the left eye every 4 (four) hours. 03/31/15   York Ram Melancon, MD  glimepiride (AMARYL) 2 MG tablet Take 2 mg by mouth daily with breakfast.    Historical Provider, MD  guaiFENesin-codeine (ROBITUSSIN AC) 100-10 MG/5ML syrup Take 5 mLs by mouth 3 (three) times daily as needed for cough.    Historical Provider, MD  hydrochlorothiazide (MICROZIDE) 12.5 MG capsule Take 12.5 mg by mouth daily.    Historical Provider, MD  HYDROcodone-homatropine (HYCODAN) 5-1.5 MG/5ML syrup Take 5 mLs by mouth every 6 (six) hours as needed for cough.    Historical Provider, MD  ibuprofen (ADVIL,MOTRIN) 400 MG tablet Take 1 tablet (400 mg total) by mouth every 6 (six) hours as needed. 03/31/15   York Ram Melancon, MD  insulin glargine (LANTUS) 100 UNIT/ML injection Inject 100 Units into the skin at bedtime.    Historical Provider, MD  loperamide (IMODIUM) 2 MG capsule Take by mouth as needed for diarrhea or loose stools.    Historical Provider, MD  losartan (COZAAR) 25 MG tablet Take 25 mg by mouth daily.    Historical Provider, MD  metoprolol succinate (TOPROL-XL) 50 MG 24 hr tablet Take 50 mg by mouth daily. Take with or immediately following a meal.    Historical Provider, MD  rosuvastatin (CRESTOR) 5 MG tablet Take 5 mg by mouth daily.    Historical Provider, MD  tiZANidine (ZANAFLEX) 2 MG tablet Take by mouth every 6 (six) hours as needed for muscle spasms.    Historical Provider, MD  traZODone (DESYREL) 100 MG tablet Take 100 mg by mouth at bedtime.    Historical Provider, MD  valACYclovir (VALTREX) 500 MG tablet Take 500 mg by mouth 2 (two) times daily.    Historical Provider, MD   BP 187/68 mmHg  Pulse 70  Temp(Src) 98.8 F (37.1 C) (Oral)  Resp 20   SpO2 100% Physical Exam  Constitutional: She is oriented to person, place, and time. She appears well-developed and well-nourished. No distress.  HENT:  Head: Normocephalic and atraumatic.  Eyes: EOM are normal. Pupils are equal, round, and reactive to light.  Neck: Normal range of motion. Neck supple.  Cardiovascular: Normal rate and regular rhythm.  Exam reveals no gallop and no friction rub.   No murmur heard. Pulmonary/Chest: Effort normal. She has no wheezes. She has no rales.  Abdominal: Soft. She exhibits no distension. There is no tenderness.  Musculoskeletal: She exhibits tenderness. She exhibits no edema.  Pain and bruising about the left scaphoid. Mild tenderness to the base of the left metatarsal. Pulse, motor, and sensation intact distally. No other noted areas of  bony tenderness.  Neurological: She is alert and oriented to person, place, and time.  Skin: Skin is warm and dry. Bruising noted. She is not diaphoretic.  Psychiatric: She has a normal mood and affect. Her behavior is normal.  Nursing note and vitals reviewed.   ED Course  Procedures   DIAGNOSTIC STUDIES: Oxygen Saturation is 100% on RA, normal by my interpretation.    COORDINATION OF CARE: 7:43 PM Discussed results of advanced imaging. Will order pain medication and left wrist splint. Will provide pt with referral to an orthopedist. Advised pt to follow-up with orthopedist. Discussed treatment plan with pt at bedside and pt agreed to plan.  Imaging Review Dg Forearm Left  12/13/2015  CLINICAL DATA:  LEFT forearm pain, fell today, initial encounter EXAM: LEFT FOREARM - 2 VIEW COMPARISON:  None FINDINGS: Osseous demineralization. Joint spaces preserved. No acute fracture, dislocation or bone destruction. IMPRESSION: No acute osseous abnormalities. Electronically Signed   By: Lavonia Dana M.D.   On: 12/13/2015 19:41   Dg Wrist Complete Left  12/13/2015  CLINICAL DATA:  LEFT wrist pain post fall today, limited  range of motion EXAM: LEFT WRIST - COMPLETE 3+ VIEW COMPARISON:  None FINDINGS: Osseous demineralization. Degenerative changes first CMC joint. Joint spaces otherwise preserved. No acute fracture, dislocation or bone destruction. Mild regional soft tissue swelling. IMPRESSION: No acute osseous abnormalities. Electronically Signed   By: Lavonia Dana M.D.   On: 12/13/2015 19:40   Dg Shoulder Left  12/13/2015  CLINICAL DATA:  [place] today, LEFT shoulder pain EXAM: LEFT SHOULDER - 2+ VIEW COMPARISON:  05/18/2014 FINDINGS: Osseous demineralization. AC joint alignment normal. Advanced LEFT glenohumeral degenerative changes with joint space narrowing and spur formation. No acute fracture, dislocation, or bone destruction. Visualized LEFT ribs intact. IMPRESSION: Glenohumeral degenerative changes. No acute bony abnormalities. Electronically Signed   By: Lavonia Dana M.D.   On: 12/13/2015 19:39   I have personally reviewed and evaluated these images as part of my medical decision-making.   MDM   Final diagnoses:  Fall, initial encounter  Wrist pain, acute, left    82 yo F with a cc of a fall.  Complaining mostly of left wrist pain.  Xrays negative for fracture.  Pain at snuffbox.  Place in thumb spica. D/c home.   I personally performed the services described in this documentation, which was scribed in my presence. The recorded information has been reviewed and is accurate.   I have discussed the diagnosis/risks/treatment options with the patient and family and believe the pt to be eligible for discharge home to follow-up with PCP. We also discussed returning to the ED immediately if new or worsening sx occur. We discussed the sx which are most concerning (e.g., sudden worsening pain, fever, inability to tolerate by mouth) that necessitate immediate return. Medications administered to the patient during their visit and any new prescriptions provided to the patient are listed below.  Medications given during  this visit Medications  acetaminophen (TYLENOL) tablet 1,000 mg (1,000 mg Oral Given 12/13/15 2016)  oxyCODONE (Oxy IR/ROXICODONE) immediate release tablet 2.5 mg (2.5 mg Oral Given 12/13/15 2015)    Discharge Medication List as of 12/13/2015  8:18 PM      The patient appears reasonably screen and/or stabilized for discharge and I doubt any other medical condition or other [facility] requiring further screening, evaluation, or treatment in the ED at this time prior to discharge.     Deno Etienne, DO 12/13/15 2343

## 2015-12-13 NOTE — ED Notes (Addendum)
Per EMS, patient is from [facility] on [address]. Patient was walking with her walker when she mis-stepped and fell. Patient c/o left wrist, arm, shoulder, pain. No obvious deformities. Pain worsens with movement. Patient denies hitting her head. Swelling noted to left wrist.

## 2015-12-13 NOTE — ED Notes (Signed)
Assisted pt in calling daughter Judy Pimple POA @ [phone]  to come to pick pt up from  ED.

## 2017-09-06 IMAGING — CR DG SHOULDER 2+V*L*
3 series · 3 of 3 positions shown · non-contrast
Comparison: 05/18/2014

CLINICAL DATA: Fell today, LEFT shoulder pain

EXAM:
LEFT SHOULDER - 2+ VIEW

[x shoulder axillary left]
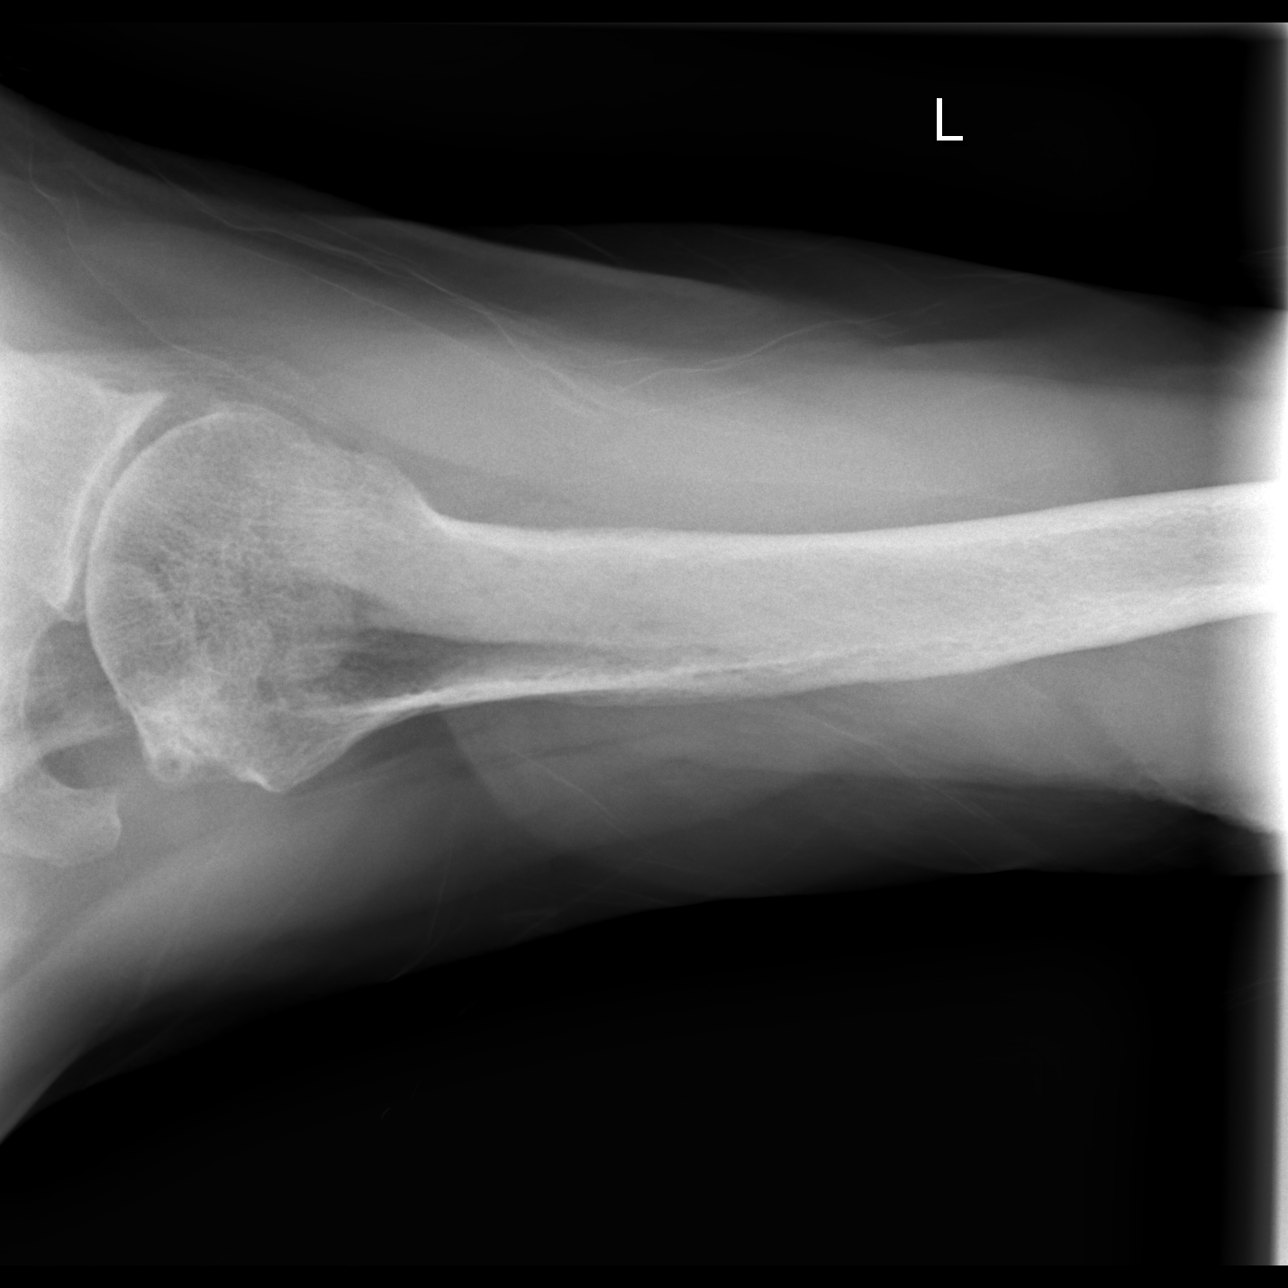

[w shoulder grashey left]
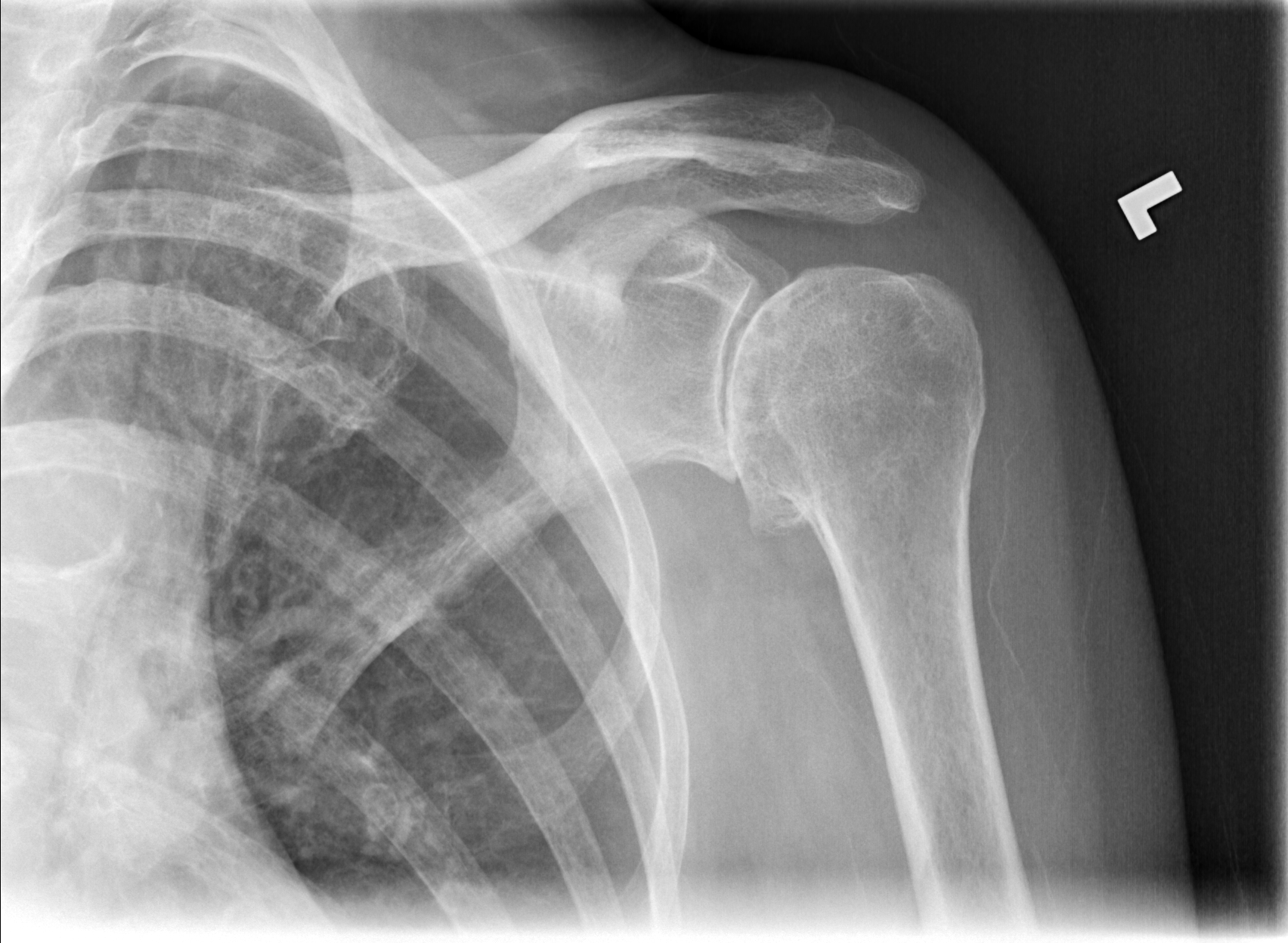

[w shoulder y view left]
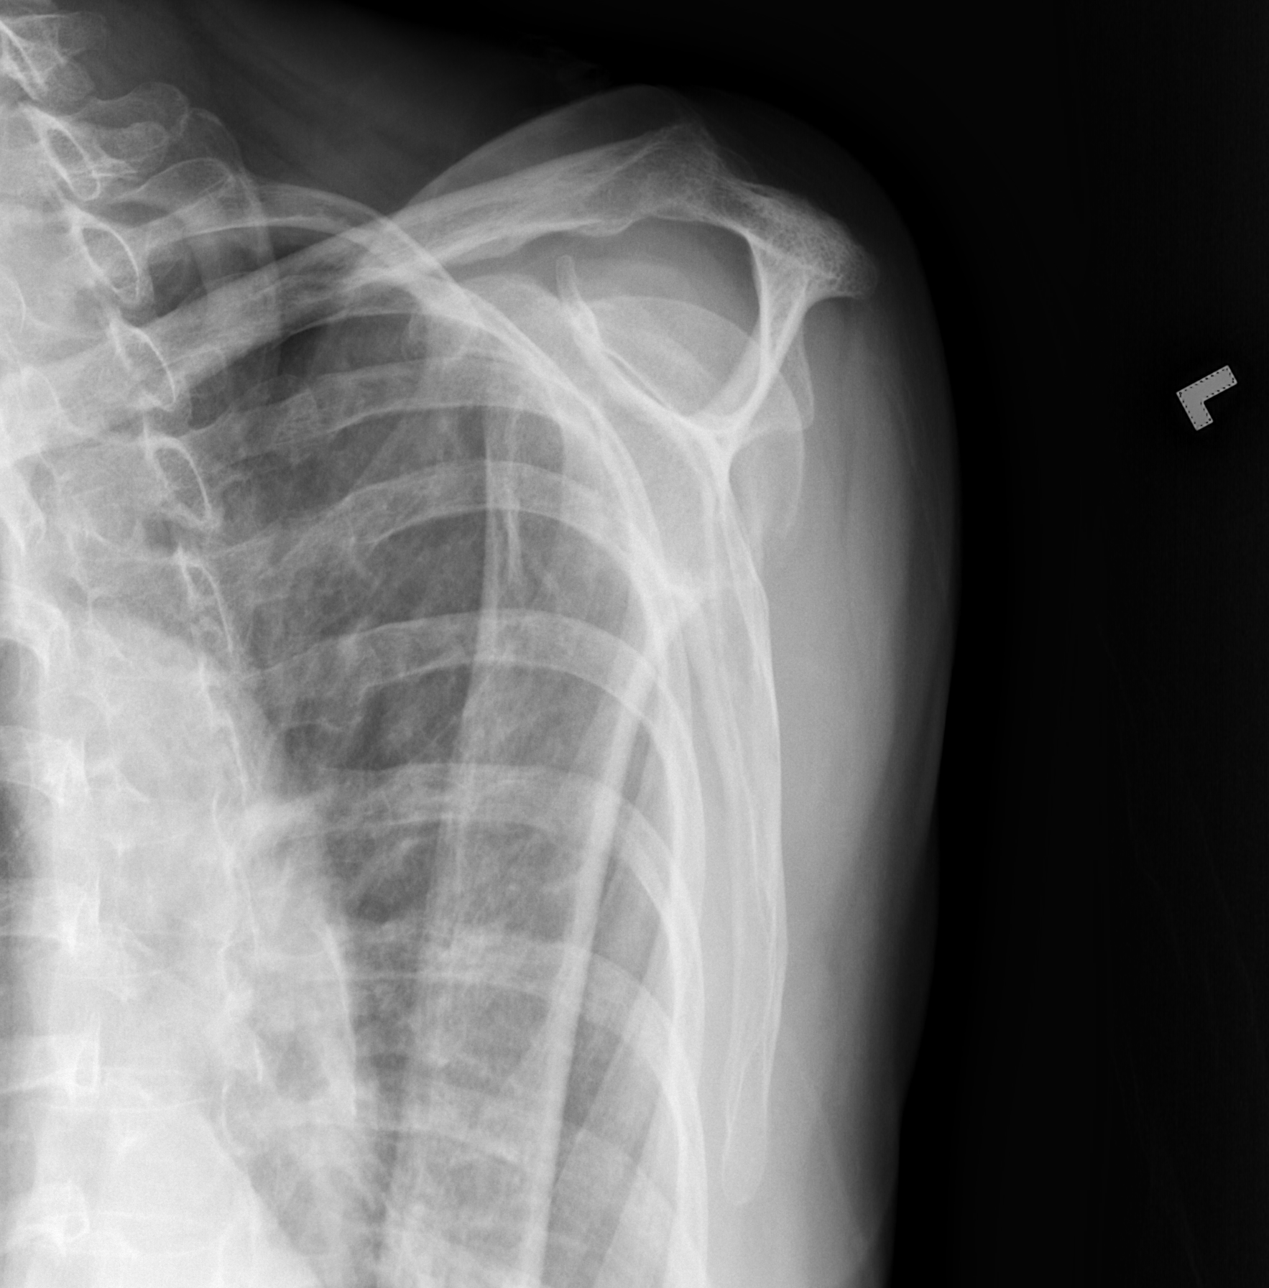

[3 of 3 positions shown; findings below may reference images not displayed]

FINDINGS: Osseous demineralization.

AC joint alignment normal.

Advanced LEFT glenohumeral degenerative changes with joint space
narrowing and spur formation.

No acute fracture, dislocation, or bone destruction.

Visualized LEFT ribs intact.
IMPRESSION: Glenohumeral degenerative changes.

No acute bony abnormalities.

## 2021-11-30 DEATH — deceased
